# Patient Record
Sex: Male | Born: 1980 | Race: White | Hispanic: No | Marital: Married | Smoking: Current every day smoker
Health system: Southern US, Community
[De-identification: ages and names within clinical notes are randomized; demographics above are authoritative.]

---

## 2016-04-01 ENCOUNTER — Emergency Department (HOSPITAL_COMMUNITY)
Admission: EM | Admit: 2016-04-01 | Discharge: 2016-04-01 | Disposition: A | Discharge status: Home / Self Care | Payer: 59 | Attending: Emergency Medicine | Admitting: Emergency Medicine

## 2016-04-01 ENCOUNTER — Encounter (HOSPITAL_COMMUNITY): Payer: Self-pay | Admitting: Emergency Medicine

## 2016-04-01 ENCOUNTER — Emergency Department (HOSPITAL_COMMUNITY): Payer: 59

## 2016-04-01 DIAGNOSIS — S99912A Unspecified injury of left ankle, initial encounter: Secondary | ICD-10-CM | POA: Diagnosis present

## 2016-04-01 DIAGNOSIS — Y929 Unspecified place or not applicable: Secondary | ICD-10-CM | POA: Insufficient documentation

## 2016-04-01 DIAGNOSIS — F17210 Nicotine dependence, cigarettes, uncomplicated: Secondary | ICD-10-CM | POA: Insufficient documentation

## 2016-04-01 DIAGNOSIS — X501XXA Overexertion from prolonged static or awkward postures, initial encounter: Secondary | ICD-10-CM | POA: Insufficient documentation

## 2016-04-01 DIAGNOSIS — S93402A Sprain of unspecified ligament of left ankle, initial encounter: Secondary | ICD-10-CM | POA: Diagnosis not present

## 2016-04-01 DIAGNOSIS — Z79899 Other long term (current) drug therapy: Secondary | ICD-10-CM | POA: Insufficient documentation

## 2016-04-01 DIAGNOSIS — Y999 Unspecified external cause status: Secondary | ICD-10-CM | POA: Diagnosis not present

## 2016-04-01 DIAGNOSIS — Z791 Long term (current) use of non-steroidal anti-inflammatories (NSAID): Secondary | ICD-10-CM | POA: Diagnosis not present

## 2016-04-01 DIAGNOSIS — Y9367 Activity, basketball: Secondary | ICD-10-CM | POA: Insufficient documentation

## 2016-04-01 MED ORDER — IBUPROFEN 800 MG PO TABS: 800.0000 mg | ORAL_TABLET | Freq: Once | ORAL | Status: DC

## 2016-04-01 NOTE — ED Provider Notes (Signed)
CSN: [id number]     Arrival date & time 04/01/16  1340 History  By signing my name below, I, Tanda Rockers, attest that this documentation has been prepared under the direction and in the presence of Ivery Quale, PA-C.  Electronically Signed: Tanda Rockers, ED Scribe. 04/01/2016. 2:41 PM.   Chief Complaint  Patient presents with  . Ankle Injury   Patient is a 35 y.o. male presenting with lower extremity injury. The history is provided by the patient. No language interpreter was used.  Ankle Injury This is a new problem. The current episode started 12 to 24 hours ago. The problem occurs rarely. The problem has not changed since onset.Pertinent negatives include no chest pain, no abdominal pain, no headaches and no shortness of breath. The symptoms are aggravated by walking. The symptoms are relieved by ice and heat. He has tried a cold compress and a warm compress for the symptoms. The treatment provided mild relief.     HPI Comments: Erik Kline is a 35 y.o. male who presents to the Emergency Department complaining of sudden onset, constant, left ankle pain that began last night. Pt states that he was playing basketball last night and upon coming down after jumping to make a shot, he twisted his ankle on the side of the court. Pt wrapped his ankle in an ace bandage last night and applied ice/heat with some relief. The pain is exacerbated with walking. He also complains of mild swelling to the area. Denies weakness, numbness, tingling, or any other associated symptoms.   History reviewed. No pertinent past medical history. History reviewed. No pertinent past surgical history. No family history on file. Social History  Substance Use Topics  . Smoking status: Current Every Day Smoker -- 0.50 packs/day    Types: Cigarettes  . Smokeless tobacco: None  . Alcohol Use: Yes     Comment: occ    Review of Systems  Respiratory: Negative for shortness of breath.   Cardiovascular: Negative for  chest pain.  Gastrointestinal: Negative for abdominal pain.  Musculoskeletal: Positive for joint swelling and arthralgias (left ankle).  Skin: Negative for wound.  Neurological: Negative for weakness, numbness and headaches.  All other systems reviewed and are negative.  Allergies  Review of patient's allergies indicates no known allergies.  Home Medications   Prior to Admission medications   Medication Sig Start Date End Date Taking? Authorizing Provider  ALPRAZolam (XANAX) 0.5 MG tablet TAKE 1/2 TO 1 TABLET BY MOUTH TWICE DAILY AS NEEDED 03/07/16  Yes Historical Provider, MD  ibuprofen (ADVIL,MOTRIN) 800 MG tablet Take 800 mg by mouth 3 (three) times daily as needed. 03/07/16  Yes Historical Provider, MD  testosterone cypionate (DEPOTESTOSTERONE CYPIONATE) 200 MG/ML injection INJECT 1 ML INTRAMUSCULARLY ONCE EVERY FOUR WEEKS 01/01/16  Yes Historical Provider, MD   BP 137/78 mmHg  Pulse 86  Temp(Src) 98.3 F (36.8 C) (Oral)  Resp 18  Ht  (1.88 m)  Wt 240 lb (108.863 kg)  BMI 30.80 kg/m2  SpO2 98%   Physical Exam  Constitutional: He is oriented to person, place, and time. He appears well-developed and well-nourished. No distress.  HENT:  Head: Normocephalic and atraumatic.  Eyes: Conjunctivae and EOM are normal.  Neck: Neck supple. No tracheal deviation present.  Cardiovascular: Normal rate.   Pulmonary/Chest: Effort normal. No respiratory distress.  Musculoskeletal: Normal range of motion. He exhibits tenderness.  Capillary refill of the left lower extremity is less than 2 seconds.  DP pulse is 2+.  Mild  to moderate lateral and medial malleolus tenderness.  The achilles tendon is intact.  No tibial deformity.  No effusion or deformity of the knee.  Mild to moderate bruising of the lateral malleolus.   Neurological: He is alert and oriented to person, place, and time.  Skin: Skin is warm and dry.  Psychiatric: He has a normal mood and affect. His behavior is normal.   Nursing note and vitals reviewed.   ED Course  Procedures (including critical care time)  DIAGNOSTIC STUDIES: Oxygen Saturation is 98% on RA, normal by my interpretation.    COORDINATION OF CARE: 2:40 PM-Discussed treatment plan which includes splint and crutches with pt at bedside and pt agreed to plan.   Labs Review Labs Reviewed - No data to display  Imaging Review Dg Ankle Complete Left  04/01/2016  CLINICAL DATA:  Twisting injury left ankle playing basketball yesterday. Pain. Initial encounter. EXAM: LEFT ANKLE COMPLETE - 3+ VIEW COMPARISON:  None. FINDINGS: There is no evidence of fracture, dislocation, or joint effusion. There is no evidence of arthropathy or other focal bone abnormality. Soft tissues are unremarkable. IMPRESSION: Negative exam. Electronically Signed   By: Drusilla Kannerhomas  Dalessio M.D.   On: 04/01/2016 14:10   I have personally reviewed and evaluated these images as part of my medical decision-making.   EKG Interpretation None      MDM  No neuro or vascular deficit. Xray of the left ankle is negative for fx or dislocation. ASO applied. Pt fitted with crutches. Pt to follow up with orthopedics in the office if not improving.   Final diagnoses:  Ankle sprain, left, initial encounter    **I personally performed the services described in this documentation, which was scribed in my presence. The recorded information has been reviewed and is accurate.*I have reviewed nursing notes, vital signs, and all appropriate lab and imaging results for this patient.     Ivery QualeHobson Jenavive Lamboy, PA-C 04/03/16 1211  Eber HongBrian Miller, MD 04/04/16 [phone]

## 2016-04-01 NOTE — Discharge Instructions (Signed)
Your x-rays are negative for fracture or dislocation. Your examination is consistent with an ankle sprain. Please use the ankle stirrup splint and crutches until you can safely apply weight to the lower extremity. Please apply ice. Please see the orthopedic specialist listed above or the orthopedic specialist of your choice if not improving. Ankle Sprain An ankle sprain is an injury to the strong, fibrous tissues (ligaments) that hold your ankle bones together.  HOME CARE   Put ice on your ankle for 1-2 days or as told by your doctor.  Put ice in a plastic bag.  Place a towel between your skin and the bag.  Leave the ice on for 15-20 minutes at a time, every 2 hours while you are awake.  Only take medicine as told by your doctor.  Raise (elevate) your injured ankle above the level of your heart as much as possible for 2-3 days.  Use crutches if your doctor tells you to. Slowly put your own weight on the affected ankle. Use the crutches until you can walk without pain.  If you have a plaster splint:  Do not rest it on anything harder than a pillow for 24 hours.  Do not put weight on it.  Do not get it wet.  Take it off to shower or bathe.  If given, use an elastic wrap or support stocking for support. Take the wrap off if your toes lose feeling (numb), tingle, or turn cold or blue.  If you have an air splint:  Add or let out air to make it comfortable.  Take it off at night and to shower and bathe.  Wiggle your toes and move your ankle up and down often while you are wearing it. GET HELP IF:  You have rapidly increasing bruising or puffiness (swelling).  Your toes feel very cold.  You lose feeling in your foot.  Your medicine does not help your pain. GET HELP RIGHT AWAY IF:   Your toes lose feeling (numb) or turn blue.  You have severe pain that is increasing. MAKE SURE YOU:   Understand these instructions.  Will watch your condition.  Will get help right away  if you are not doing well or get worse.   This information is not intended to replace advice given to you by your health care provider. Make sure you discuss any questions you have with your health care provider.   Document Released: 05/06/2008 Document Revised: 12/09/2014 Document Reviewed: 06/01/2012 Else[employer] Interactive Patient Education [employer]2016 [employer]

## 2016-04-01 NOTE — ED Notes (Signed)
Pt c/o LT ankle injury after playing basketball. Reports edema and bruising. Pt has ace bandage and reports mild relief with compression.

## 2017-04-16 ENCOUNTER — Ambulatory Visit: Payer: Self-pay | Admitting: "Endocrinology

## 2017-04-29 ENCOUNTER — Ambulatory Visit: Payer: Self-pay | Admitting: "Endocrinology

## 2017-10-04 IMAGING — DX DG ANKLE COMPLETE 3+V*L*
3 series · 3 of 3 positions shown · non-contrast
Comparison: None.

CLINICAL DATA: Twisting injury left ankle playing basketball
yesterday. Pain. Initial encounter.

EXAM:
LEFT ANKLE COMPLETE - 3+ VIEW

[ankle ap]
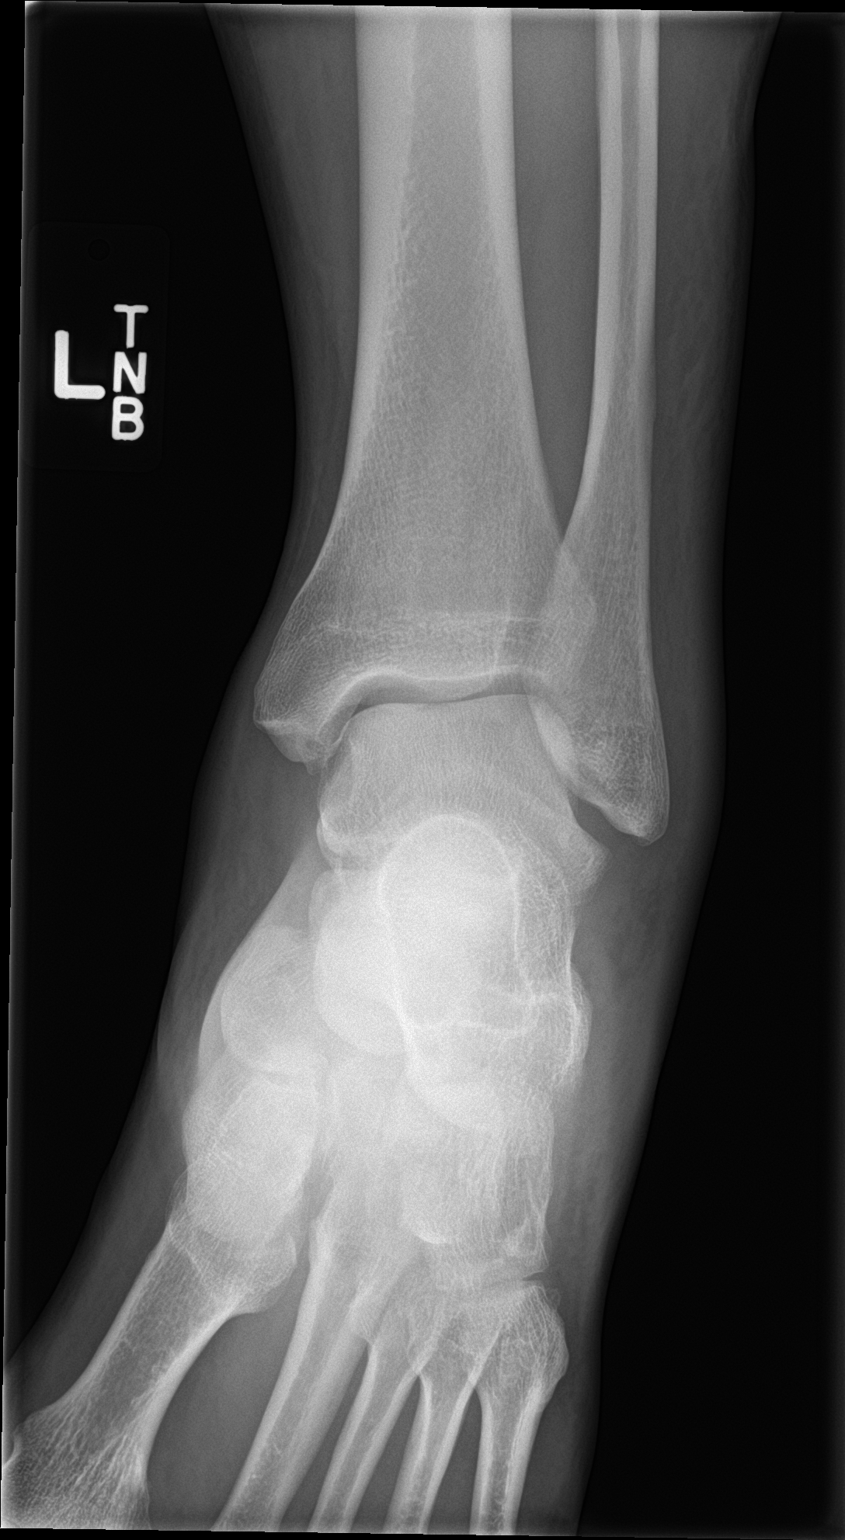

[ankle obl]
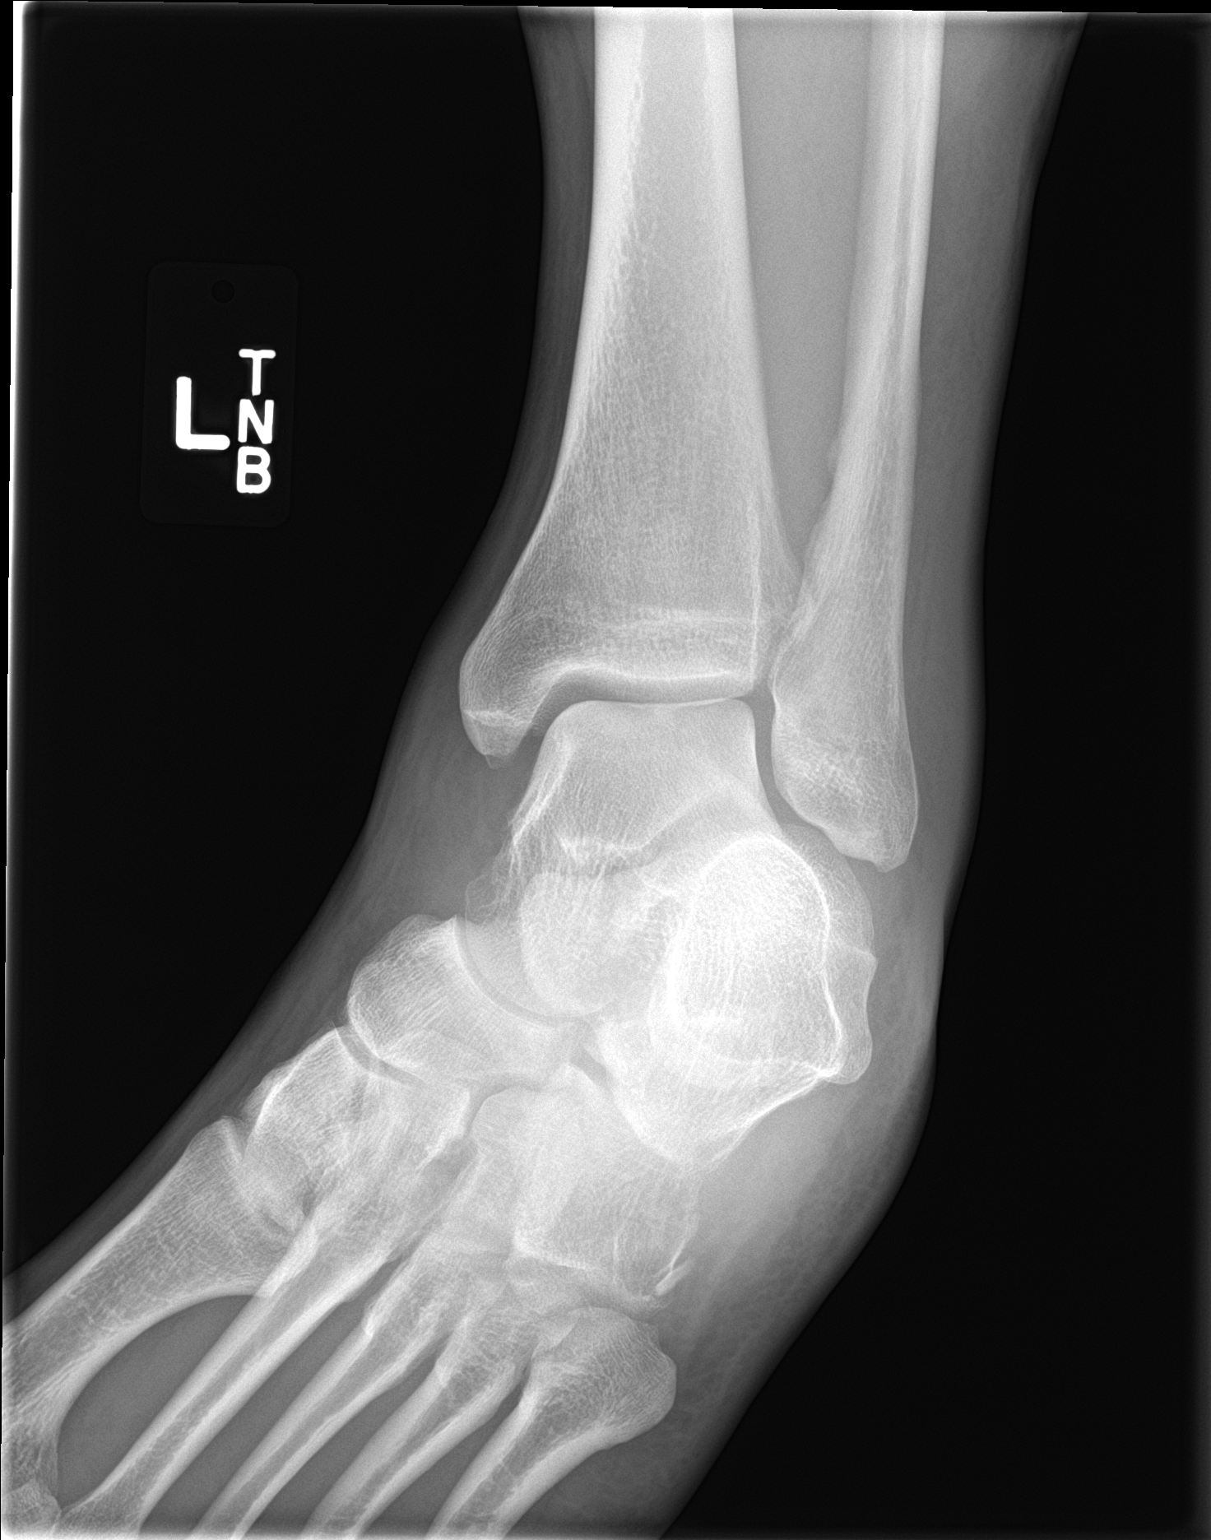

[ankle lat]
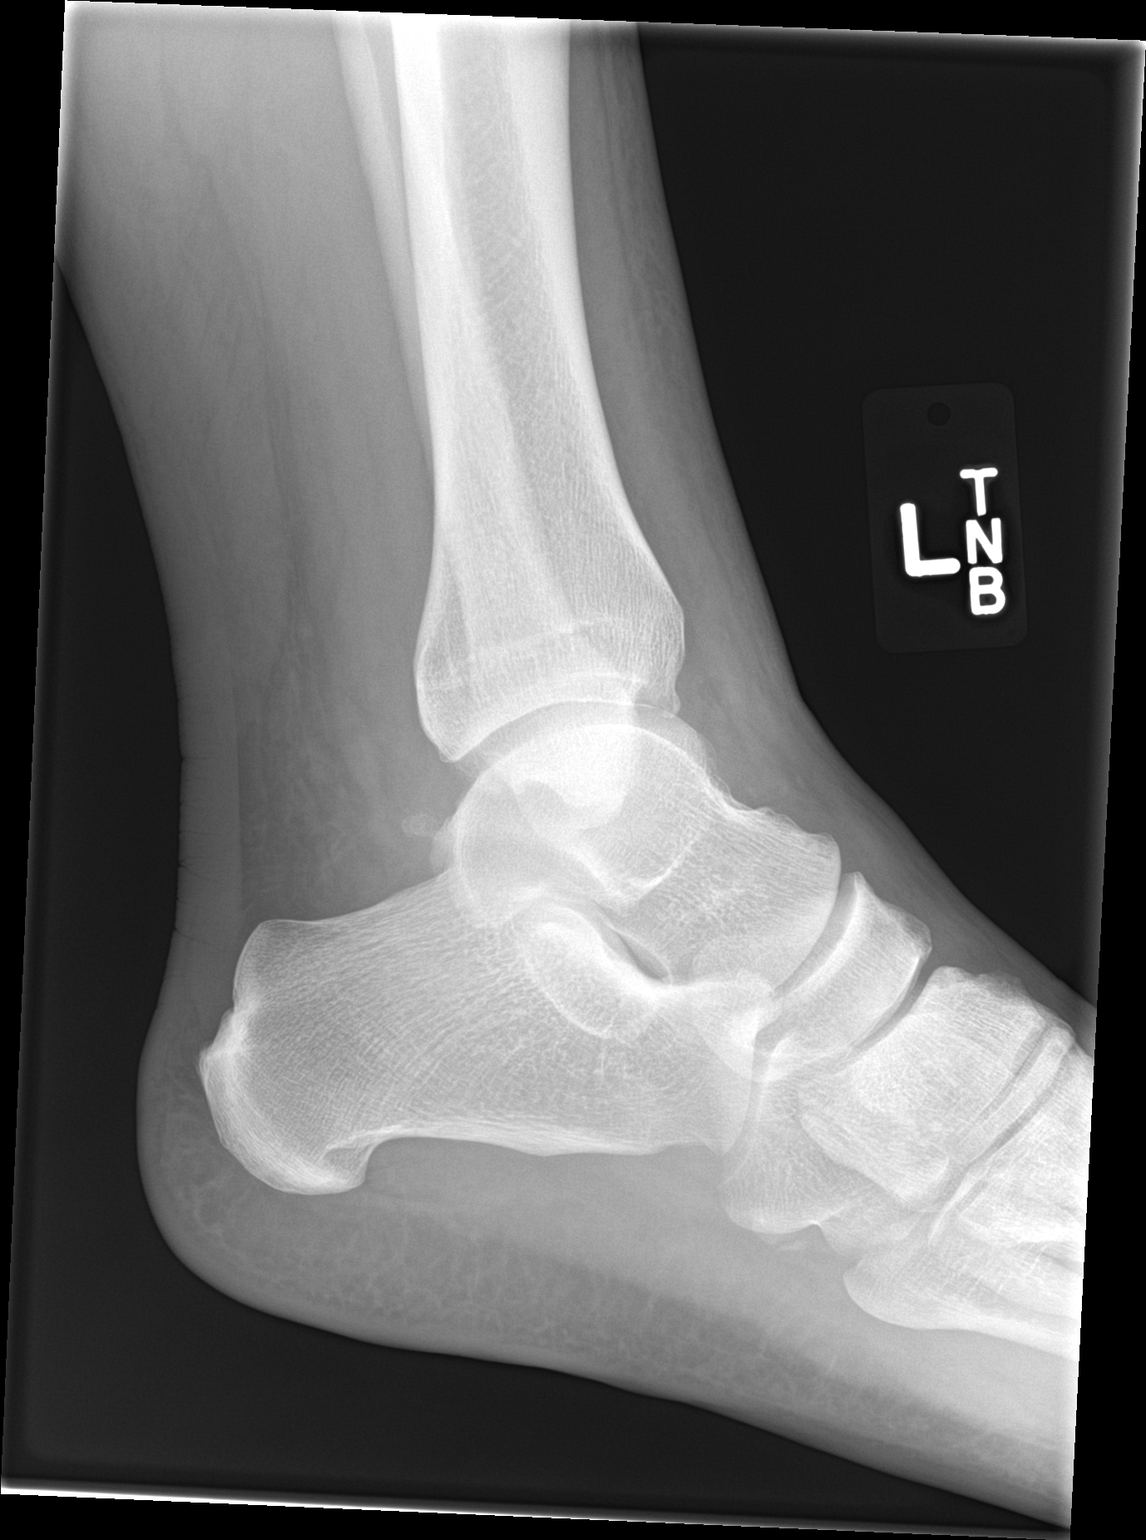

[3 of 3 positions shown; findings below may reference images not displayed]

FINDINGS: There is no evidence of fracture, dislocation, or joint effusion.
There is no evidence of arthropathy or other focal bone abnormality.
Soft tissues are unremarkable.
IMPRESSION: Negative exam.

## 2018-10-27 DIAGNOSIS — F17210 Nicotine dependence, cigarettes, uncomplicated: Secondary | ICD-10-CM | POA: Diagnosis not present

## 2018-10-27 DIAGNOSIS — F419 Anxiety disorder, unspecified: Secondary | ICD-10-CM | POA: Diagnosis not present

## 2018-10-27 DIAGNOSIS — I10 Essential (primary) hypertension: Secondary | ICD-10-CM | POA: Diagnosis not present

## 2018-10-27 DIAGNOSIS — E291 Testicular hypofunction: Secondary | ICD-10-CM | POA: Diagnosis not present

## 2018-10-27 DIAGNOSIS — Z6835 Body mass index (BMI) 35.0-35.9, adult: Secondary | ICD-10-CM | POA: Diagnosis not present

## 2018-11-09 DIAGNOSIS — R7301 Impaired fasting glucose: Secondary | ICD-10-CM | POA: Diagnosis not present

## 2018-11-09 DIAGNOSIS — F17219 Nicotine dependence, cigarettes, with unspecified nicotine-induced disorders: Secondary | ICD-10-CM | POA: Diagnosis not present

## 2018-11-09 DIAGNOSIS — Z716 Tobacco abuse counseling: Secondary | ICD-10-CM | POA: Diagnosis not present

## 2018-11-09 DIAGNOSIS — E291 Testicular hypofunction: Secondary | ICD-10-CM | POA: Diagnosis not present

## 2018-11-09 DIAGNOSIS — R030 Elevated blood-pressure reading, without diagnosis of hypertension: Secondary | ICD-10-CM | POA: Diagnosis not present

## 2018-11-09 DIAGNOSIS — E782 Mixed hyperlipidemia: Secondary | ICD-10-CM | POA: Diagnosis not present

## 2019-01-25 DIAGNOSIS — E291 Testicular hypofunction: Secondary | ICD-10-CM | POA: Diagnosis not present

## 2019-03-24 DIAGNOSIS — R7301 Impaired fasting glucose: Secondary | ICD-10-CM | POA: Diagnosis not present

## 2019-03-24 DIAGNOSIS — E782 Mixed hyperlipidemia: Secondary | ICD-10-CM | POA: Diagnosis not present

## 2019-03-29 DIAGNOSIS — E782 Mixed hyperlipidemia: Secondary | ICD-10-CM | POA: Diagnosis not present

## 2019-03-29 DIAGNOSIS — R7301 Impaired fasting glucose: Secondary | ICD-10-CM | POA: Diagnosis not present

## 2019-03-29 DIAGNOSIS — G4700 Insomnia, unspecified: Secondary | ICD-10-CM | POA: Diagnosis not present

## 2019-06-29 DIAGNOSIS — E782 Mixed hyperlipidemia: Secondary | ICD-10-CM | POA: Diagnosis not present

## 2019-06-29 DIAGNOSIS — R7301 Impaired fasting glucose: Secondary | ICD-10-CM | POA: Diagnosis not present

## 2019-06-29 DIAGNOSIS — E291 Testicular hypofunction: Secondary | ICD-10-CM | POA: Diagnosis not present

## 2019-07-05 DIAGNOSIS — R7301 Impaired fasting glucose: Secondary | ICD-10-CM | POA: Diagnosis not present

## 2019-07-05 DIAGNOSIS — E291 Testicular hypofunction: Secondary | ICD-10-CM | POA: Diagnosis not present

## 2019-07-05 DIAGNOSIS — G4700 Insomnia, unspecified: Secondary | ICD-10-CM | POA: Diagnosis not present

## 2019-07-05 DIAGNOSIS — E782 Mixed hyperlipidemia: Secondary | ICD-10-CM | POA: Diagnosis not present

## 2019-10-18 DIAGNOSIS — R7301 Impaired fasting glucose: Secondary | ICD-10-CM | POA: Diagnosis not present

## 2019-10-18 DIAGNOSIS — E291 Testicular hypofunction: Secondary | ICD-10-CM | POA: Diagnosis not present

## 2019-10-18 DIAGNOSIS — E782 Mixed hyperlipidemia: Secondary | ICD-10-CM | POA: Diagnosis not present

## 2019-10-25 DIAGNOSIS — Z6835 Body mass index (BMI) 35.0-35.9, adult: Secondary | ICD-10-CM | POA: Diagnosis not present

## 2019-10-25 DIAGNOSIS — F17219 Nicotine dependence, cigarettes, with unspecified nicotine-induced disorders: Secondary | ICD-10-CM | POA: Diagnosis not present

## 2019-10-25 DIAGNOSIS — R030 Elevated blood-pressure reading, without diagnosis of hypertension: Secondary | ICD-10-CM | POA: Diagnosis not present

## 2020-01-31 DIAGNOSIS — F17219 Nicotine dependence, cigarettes, with unspecified nicotine-induced disorders: Secondary | ICD-10-CM | POA: Diagnosis not present

## 2020-01-31 DIAGNOSIS — R7301 Impaired fasting glucose: Secondary | ICD-10-CM | POA: Diagnosis not present

## 2020-01-31 DIAGNOSIS — R030 Elevated blood-pressure reading, without diagnosis of hypertension: Secondary | ICD-10-CM | POA: Diagnosis not present

## 2020-01-31 DIAGNOSIS — Z6835 Body mass index (BMI) 35.0-35.9, adult: Secondary | ICD-10-CM | POA: Diagnosis not present

## 2020-01-31 DIAGNOSIS — E782 Mixed hyperlipidemia: Secondary | ICD-10-CM | POA: Diagnosis not present

## 2020-02-07 DIAGNOSIS — D72829 Elevated white blood cell count, unspecified: Secondary | ICD-10-CM | POA: Diagnosis not present

## 2020-02-07 DIAGNOSIS — R7301 Impaired fasting glucose: Secondary | ICD-10-CM | POA: Diagnosis not present

## 2020-02-07 DIAGNOSIS — E782 Mixed hyperlipidemia: Secondary | ICD-10-CM | POA: Diagnosis not present

## 2020-02-07 DIAGNOSIS — R030 Elevated blood-pressure reading, without diagnosis of hypertension: Secondary | ICD-10-CM | POA: Diagnosis not present

## 2020-05-15 ENCOUNTER — Ambulatory Visit: Payer: 59 | Admitting: "Endocrinology
